# Patient Record
Sex: Male | Born: 1940 | Race: White | Hispanic: No | Marital: Single | Smoking: Former smoker
Health system: Southern US, Community
[De-identification: ages and names within clinical notes are randomized; demographics above are authoritative.]

## PROBLEM LIST (undated history)

## (undated) DIAGNOSIS — I10 Essential (primary) hypertension: Secondary | ICD-10-CM

## (undated) DIAGNOSIS — Z87442 Personal history of urinary calculi: Secondary | ICD-10-CM

## (undated) DIAGNOSIS — K219 Gastro-esophageal reflux disease without esophagitis: Secondary | ICD-10-CM

## (undated) DIAGNOSIS — M1990 Unspecified osteoarthritis, unspecified site: Secondary | ICD-10-CM

## (undated) DIAGNOSIS — D649 Anemia, unspecified: Secondary | ICD-10-CM

## (undated) HISTORY — PX: HERNIA REPAIR: SHX51

## (undated) HISTORY — PX: TONSILLECTOMY: SUR1361

## (undated) HISTORY — PX: ROTATOR CUFF REPAIR: SHX139

---

## 2020-12-13 ENCOUNTER — Other Ambulatory Visit: Payer: Self-pay | Admitting: Neurosurgery

## 2020-12-21 ENCOUNTER — Encounter (HOSPITAL_COMMUNITY): Payer: Self-pay

## 2020-12-21 ENCOUNTER — Other Ambulatory Visit: Payer: Self-pay

## 2020-12-21 ENCOUNTER — Encounter (HOSPITAL_COMMUNITY)
Admission: RE | Admit: 2020-12-21 | Discharge: 2020-12-21 | Disposition: A | Discharge status: Home / Self Care | Payer: Medicare Other | Source: Ambulatory Visit | Attending: Neurosurgery | Admitting: Neurosurgery

## 2020-12-21 DIAGNOSIS — Z20822 Contact with and (suspected) exposure to covid-19: Secondary | ICD-10-CM | POA: Diagnosis not present

## 2020-12-21 DIAGNOSIS — Z01812 Encounter for preprocedural laboratory examination: Secondary | ICD-10-CM | POA: Diagnosis present

## 2020-12-21 HISTORY — DX: Personal history of urinary calculi: Z87.442

## 2020-12-21 HISTORY — DX: Gastro-esophageal reflux disease without esophagitis: K21.9

## 2020-12-21 HISTORY — DX: Essential (primary) hypertension: I10

## 2020-12-21 HISTORY — DX: Unspecified osteoarthritis, unspecified site: M19.90

## 2020-12-21 HISTORY — DX: Anemia, unspecified: D64.9

## 2020-12-21 LAB — BASIC METABOLIC PANEL
Anion gap: 8 (ref 5–15)
BUN: 31 mg/dL — ABNORMAL HIGH (ref 8–23)
CO2: 24 mmol/L (ref 22–32)
Calcium: 9.1 mg/dL (ref 8.9–10.3)
Chloride: 107 mmol/L (ref 98–111)
Creatinine, Ser: 1.18 mg/dL (ref 0.61–1.24)
GFR, Estimated: >60 mL/min (ref >60)
Glucose, Bld: 88 mg/dL (ref 70–99)
Potassium: 4.1 mmol/L (ref 3.5–5.1)
Sodium: 139 mmol/L (ref 135–145)

## 2020-12-21 LAB — SURGICAL PCR SCREEN
MRSA, PCR: POSITIVE — AB
Staphylococcus aureus: POSITIVE — AB

## 2020-12-21 LAB — CBC
HCT: 36.9 % — ABNORMAL LOW (ref 39.0–52.0)
Hemoglobin: 12.4 g/dL — ABNORMAL LOW (ref 13.0–17.0)
MCH: 31.2 pg (ref 26.0–34.0)
MCHC: 33.6 g/dL (ref 30.0–36.0)
MCV: 92.9 fL (ref 80.0–100.0)
Platelets: 194 10*3/uL (ref 150–400)
RBC: 3.97 MIL/uL — ABNORMAL LOW (ref 4.22–5.81)
RDW: 12.9 % (ref 11.5–15.5)
WBC: 7.1 10*3/uL (ref 4.0–10.5)
nRBC: 0 % (ref 0.0–0.2)

## 2020-12-21 LAB — TYPE AND SCREEN
ABO/RH(D): A POS
Antibody Screen: NEGATIVE

## 2020-12-21 LAB — SARS CORONAVIRUS 2 (TAT 6-24 HRS): SARS Coronavirus 2: NEGATIVE

## 2020-12-21 NOTE — Progress Notes (Signed)
Lab called stating patient was +MRSA. Will need to be treated with Profend on DOS per protocol.

## 2020-12-21 NOTE — Progress Notes (Signed)
PCP: Alinda Deem, MD Cardiologist: Dr. Kathryne Sharper in [place]  EKG: 12/21/20 CXR: na ECHO: denies Stress Test: 2020.  Requested record Cardiac Cath: denies  Fasting Blood Sugar- na Checks Blood Sugar__na_ times a day  OSA/CPAP: No  ASA/Blood Thinners: No  Covid test 12/21/20 at PAT  Anesthesia Review: No, only saw cards because he passed out after playing tennis.  Negative cardiac workup per patient.  States it was from heat.  Patient denies shortness of breath, fever, cough, and chest pain at PAT appointment.  Patient verbalized understanding of instructions provided today at the PAT appointment.  Patient asked to review instructions at home and day of surgery.

## 2020-12-21 NOTE — Progress Notes (Signed)
Surgical Instructions    Your procedure is scheduled on 12/25/20.  Report to [facility] Main Entrance "A" at 5:30 A.M., then check in with the Admitting office.  Call this number if you have problems the morning of surgery:  [phone]   If you have any questions prior to your surgery date call [phone]: Open Monday-Friday 8am-4pm    Remember:  Do not eat aor drink fter midnight the night before your surgery     Take these medicines the morning of surgery with A SIP OF WATER: gabapentin (NEURONTIN) levothyroxine (SYNTHROID) simvastatin (ZOCOR)  traMADol (ULTRAM) - if needed    As of today, STOP taking any Aspirin (unless otherwise instructed by your surgeon), Meloxicam, Aleve, Naproxen, Ibuprofen, Motrin, Advil, Goody's, BC's, all herbal medications, fish oil, and all vitamins.          Do not wear jewelry  Do not wear lotions, powders, colognes, or deodorant. Do not shave 48 hours prior to surgery.  Men may shave face and neck. Do not bring valuables to the hospital.              [facility] is not responsible for any belongings or valuables.  Do NOT Smoke (Tobacco/Vaping) or drink Alcohol 24 hours prior to your procedure If you use a CPAP at night, you may bring all equipment for your overnight stay.   Contacts, glasses, dentures or bridgework may not be worn into surgery, please bring cases for these belongings   For patients admitted to the hospital, discharge time will be determined by your treatment team.   Patients discharged the day of surgery will not be allowed to drive home, and someone needs to stay with them for 24 hours.  ONLY 1 SUPPORT PERSON MAY BE PRESENT WHILE YOU ARE IN SURGERY. IF YOU ARE TO BE ADMITTED ONCE YOU ARE IN YOUR ROOM YOU WILL BE ALLOWED TWO (2) VISITORS.  Minor children may have two parents present. Special consideration for safety and communication needs will be reviewed on a case by case basis.  Special instructions:    Oral Hygiene  is also important to reduce your risk of infection.  Remember - BRUSH YOUR TEETH THE MORNING OF SURGERY WITH YOUR REGULAR TOOTHPASTE   [place]- Preparing For Surgery  Before surgery, you can play an important role. Because skin is not sterile, your skin needs to be as free of germs as possible. You can reduce the number of germs on your skin by washing with CHG (chlorahexidine gluconate) Soap before surgery.  CHG is an antiseptic cleaner which kills germs and bonds with the skin to continue killing germs even after washing.     Please do not use if you have an allergy to CHG or antibacterial soaps. If your skin becomes reddened/irritated stop using the CHG.  Do not shave (including legs and underarms) for at least 48 hours prior to first CHG shower. It is OK to shave your face.  Please follow these instructions carefully.     Shower the NIGHT BEFORE SURGERY and the MORNING OF SURGERY with CHG Soap.   If you chose to wash your hair, wash your hair first as usual with your normal shampoo. After you shampoo, rinse your hair and body thoroughly to remove the shampoo.  Then [employer] and genitals (private parts) with your normal soap and rinse thoroughly to remove soap.  After that Use CHG Soap as you would any other liquid soap. You can apply CHG directly to the  skin and wash gently with a scrungie or a clean washcloth.   Apply the CHG Soap to your body ONLY FROM THE NECK DOWN.  Do not use on open wounds or open sores. Avoid contact with your eyes, ears, mouth and genitals (private parts). Wash Face and genitals (private parts)  with your normal soap.   Wash thoroughly, paying special attention to the area where your surgery will be performed.  Thoroughly rinse your body with warm water from the neck down.  DO NOT shower/wash with your normal soap after using and rinsing off the CHG Soap.  Pat yourself dry with a CLEAN TOWEL.  Wear CLEAN PAJAMAS to bed the night before surgery  Place  CLEAN SHEETS on your bed the night before your surgery  DO NOT SLEEP WITH PETS.   Day of Surgery:  Take a shower with CHG soap. Wear Clean/Comfortable clothing the morning of surgery Do not apply any deodorants/lotions.   Remember to brush your teeth WITH YOUR REGULAR TOOTHPASTE.   Please read over the following fact sheets that you were given.

## 2020-12-24 ENCOUNTER — Other Ambulatory Visit: Payer: Self-pay | Admitting: Neurosurgery

## 2020-12-24 NOTE — Anesthesia Preprocedure Evaluation (Addendum)
Anesthesia Evaluation  Patient identified by MRN, date of birth, ID band Patient awake    Reviewed: Allergy & Precautions, NPO status , Patient's Chart, lab work & pertinent test results  History of Anesthesia Complications Negative for: history of anesthetic complications  Airway Mallampati: III  TM Distance: >3 FB Neck ROM: Full    Dental  (+) Teeth Intact, Dental Advisory Given, Caps,    Pulmonary former smoker,    Pulmonary exam normal breath sounds clear to auscultation       Cardiovascular hypertension, Pt. on medications (-) angina(-) CAD and (-) Past MI Normal cardiovascular exam Rhythm:Regular Rate:Normal     Neuro/Psych Spinal stenosis of lumbar region with neurogenic claudication    GI/Hepatic Neg liver ROS, GERD  ,  Endo/Other  Hypothyroidism   Renal/GU negative Renal ROS     Musculoskeletal  (+) Arthritis ,   Abdominal   Peds  Hematology  (+) Blood dyscrasia, anemia ,   Anesthesia Other Findings   Reproductive/Obstetrics                           Anesthesia Physical Anesthesia Plan  ASA: 2  Anesthesia Plan: General   Post-op Pain Management:    Induction: Intravenous  PONV Risk Score and Plan: 2 and Dexamethasone and Ondansetron  Airway Management Planned: Oral ETT  Additional Equipment:   Intra-op Plan:   Post-operative Plan: Extubation in OR  Informed Consent: I have reviewed the patients History and Physical, chart, labs and discussed the procedure including the risks, benefits and alternatives for the proposed anesthesia with the patient or authorized representative who has indicated his/her understanding and acceptance.     Dental advisory given  Plan Discussed with: CRNA  Anesthesia Plan Comments:        Anesthesia Quick Evaluation

## 2020-12-24 NOTE — Progress Notes (Signed)
Patient called today with emergency contact information.   Brother: Jah, Alarid Cell:  [phone] Home: [phone]

## 2020-12-24 NOTE — H&P (Signed)
Patient ID:   [id number] Patient: Harry Hale  Date of Birth: 1940-09-10 Visit Type: Office Visit   Date: 12/12/2020 09:45 AM Provider: Danae Orleans. Venetia Maxon MD   This 80 year old male presents for back pain.  HISTORY OF PRESENT ILLNESS: 1.  back pain  Patient returns for review of studies and discussion of surgical options.  He was seen by Dr. Alesia Morin in [place] on May 27th who suggested L4-5 decompression and fusion.  He has seen Dr. Myrle Sheng for 2 epidural injections in the past.  Currently he reports bilateral lower extremity pain with rectal numbness and "hot" pain periodically.  Tramadol 50 mg at bedtime "miracle drug" helpful, allows rest Gabapentin 300 mg taken b.i.d.  X-ray and MRI on canopy  I reviewed the patient's imaging with him.  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  I reviewed his imaging with him and my recommendation is based on the severity of his back pain and bilateral lower extremity pain that we go ahead with decompression and fusion at the L4-5 level.  The patient is here to get some relief and says he cannot continue to live like he is.   He has EHL weakness at 4/5 on the right.  He has a positive seated straight leg raise on the right.      Medical/Surgical/Interim History Reviewed, no change.  Last detailed document date:11/13/2020.     PAST MEDICAL HISTORY, SURGICAL HISTORY, FAMILY HISTORY, SOCIAL HISTORY AND REVIEW OF SYSTEMS I have reviewed the patient's past medical, surgical, family and social history as well as the comprehensive review of systems as included on the [facility] history form dated 11/13/2020, which I have signed.  Family History: Reviewed, no changes.  Last  detailed document date:11/13/2020.   Social History: Reviewed, no changes. Last detailed document date: 11/13/2020.    MEDICATIONS: (added, continued or stopped this visit) Started Medication Directions Instruction Stopped  gabapentin 300 mg capsule take 1 capsule by oral route 2 times every day    levothyroxine 88 mcg capsule take 1 capsule by oral route  every day    losartan 50 mg tablet take 1 tablet by oral route  every day   11/13/2020 methocarbamol 500 mg tablet take 1 tablet by oral route 3 times every day as needed for spasms    Mobic 15 mg tablet take 1 tablet by oral route  every day    multivitamin tablet take 1 tablet by oral route  every day    simvastatin 10 mg tablet take 1 tablet by oral route  every day in the evening   12/03/2020 tramadol 50 mg tablet take 1 tablet by oral route  every 6 hours as needed  12/13/2020    ALLERGIES: Ingredient Reaction Medication Name Comment NO KNOWN ALLERGIES    No known allergies. Reviewed, no changes.    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  The patient is having bilateral lower extremity and low-back pain.  He has weakness in  his right foot.  PLAN: L4-5 decompression and fusion.  This has been scheduled for 12/25/2020 at [facility].  Risks and benefits were discussed in detail with the patient and he wishes to proceed with surgery.  Patient education was performed in detail today.  He was given a prescription for a lumbosacral orthosis.  Orders: Diagnostic Procedures: Assessment Procedure M54.16 Lumbar Spine- AP/Lat Instruction(s)/Education: Assessment Instruction I10 Lifestyle education Z68.25 Dietary management education, guidance, and  counseling Miscellaneous: Assessment  M48.062 LSO Brace  Completed Orders (this encounter) Order Details Reason Side Interpretation Result Initial Treatment Date Region Lifestyle education Patient will follow up with Primary Care Physician.       Dietary management education, guidance, and counseling Encouraged patient to eat well balanced diet.        Assessment/Plan  # Detail Type Description  1. 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     2. Assessment Lumbar foraminal stenosis (M48.061).     3. Assessment Lumbar facet arthropathy (M47.816).     4. 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     5. 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     6. Assessment Spinal stenosis of lumbar region with neurogenic claudication (M48.062).  Plan Orders LSO Brace.     7. Assessment Lumbar radiculopathy (M54.16).     8. Assessment Essential (primary) hypertension (I10).     9. Assessment Body mass index (BMI) 25.0-25.9, adult (S50.53).  Plan Orders Today's instructions / counseling include(s) Dietary management education, guidance, and counseling. Clinical information/comments: Encouraged patient to eat well balanced diet.       Pain Management Plan Pain Scale: 7/10. Method: Numeric Pain Intensity Scale. Location: back. Onset: 07/29/2009. Duration: varies. Quality: discomforting. Pain management follow-up plan of care: Patient will continue medication management..              Provider:  Danae Orleans. Venetia Maxon MD  12/14/2020 04:17 PM    Dictation edited by: Danae Orleans. Venetia Maxon    CC Providers: Loretta Plume [address] [place],  [place]  [address]-   [facility]  [facility] [address], [address] [place], [place] [address]               Electronically signed by Danae Orleans. Venetia Maxon MD on 12/14/2020 04:17 PM

## 2020-12-25 ENCOUNTER — Inpatient Hospital Stay (HOSPITAL_COMMUNITY): Payer: Medicare Other

## 2020-12-25 ENCOUNTER — Observation Stay (HOSPITAL_COMMUNITY)
Admission: RE | Admit: 2020-12-25 | Discharge: 2020-12-26 | Disposition: A | Discharge status: Home / Self Care | Payer: Medicare Other | Attending: Neurosurgery | Admitting: Neurosurgery

## 2020-12-25 ENCOUNTER — Inpatient Hospital Stay (HOSPITAL_COMMUNITY): Payer: Medicare Other | Admitting: Anesthesiology

## 2020-12-25 ENCOUNTER — Encounter (HOSPITAL_COMMUNITY): Payer: Self-pay | Admitting: Neurosurgery

## 2020-12-25 ENCOUNTER — Encounter (HOSPITAL_COMMUNITY)
Admission: RE | Disposition: A | Discharge status: Home / Self Care | Payer: Self-pay | Source: Home / Self Care | Attending: Neurosurgery

## 2020-12-25 DIAGNOSIS — M4726 Other spondylosis with radiculopathy, lumbar region: Secondary | ICD-10-CM | POA: Insufficient documentation

## 2020-12-25 DIAGNOSIS — M48062 Spinal stenosis, lumbar region with neurogenic claudication: Secondary | ICD-10-CM | POA: Insufficient documentation

## 2020-12-25 DIAGNOSIS — I10 Essential (primary) hypertension: Secondary | ICD-10-CM | POA: Diagnosis not present

## 2020-12-25 DIAGNOSIS — M4316 Spondylolisthesis, lumbar region: Secondary | ICD-10-CM | POA: Diagnosis present

## 2020-12-25 DIAGNOSIS — Z79899 Other long term (current) drug therapy: Secondary | ICD-10-CM | POA: Insufficient documentation

## 2020-12-25 DIAGNOSIS — M5126 Other intervertebral disc displacement, lumbar region: Secondary | ICD-10-CM | POA: Diagnosis not present

## 2020-12-25 DIAGNOSIS — Z28311 Partially vaccinated for covid-19: Secondary | ICD-10-CM | POA: Insufficient documentation

## 2020-12-25 DIAGNOSIS — M5116 Intervertebral disc disorders with radiculopathy, lumbar region: Secondary | ICD-10-CM | POA: Diagnosis not present

## 2020-12-25 DIAGNOSIS — M7138 Other bursal cyst, other site: Principal | ICD-10-CM | POA: Insufficient documentation

## 2020-12-25 DIAGNOSIS — Z419 Encounter for procedure for purposes other than remedying health state, unspecified: Secondary | ICD-10-CM

## 2020-12-25 DIAGNOSIS — M549 Dorsalgia, unspecified: Secondary | ICD-10-CM | POA: Diagnosis present

## 2020-12-25 LAB — ABO/RH: ABO/RH(D): A POS

## 2020-12-25 SURGERY — POSTERIOR LUMBAR FUSION 1 LEVEL
Anesthesia: General | Site: Back

## 2020-12-25 MED ORDER — BUPIVACAINE HCL (PF) 0.5 % IJ SOLN
INTRAMUSCULAR | Status: DC | PRN
  Administered 2020-12-25: 5 mL

## 2020-12-25 MED ORDER — OXYCODONE HCL 5 MG PO TABS
5.0000 mg | ORAL_TABLET | ORAL | Status: DC | PRN
  Administered 2020-12-25: 5 mg via ORAL
  Administered 2020-12-25: 10 mg via ORAL
  Filled 2020-12-25 (×3): qty 2
  Filled 2020-12-25: qty 1

## 2020-12-25 MED ORDER — CHLORHEXIDINE GLUCONATE CLOTH 2 % EX PADS: 6.0000 | MEDICATED_PAD | Freq: Once | CUTANEOUS | Status: DC

## 2020-12-25 MED ORDER — LEVOTHYROXINE SODIUM 88 MCG PO TABS
88.0000 ug | ORAL_TABLET | Freq: Every day | ORAL | Status: DC
  Administered 2020-12-26: 88 ug via ORAL
  Filled 2020-12-25 (×2): qty 1

## 2020-12-25 MED ORDER — BISACODYL 10 MG RE SUPP: 10.0000 mg | Freq: Every day | RECTAL | Status: DC | PRN

## 2020-12-25 MED ORDER — FENTANYL CITRATE (PF) 100 MCG/2ML IJ SOLN
INTRAMUSCULAR | Status: AC
  Filled 2020-12-25: qty 2

## 2020-12-25 MED ORDER — SODIUM CHLORIDE 0.9 % IV SOLN
250.0000 mL | INTRAVENOUS | Status: DC
  Administered 2020-12-25: 250 mL via INTRAVENOUS

## 2020-12-25 MED ORDER — CEFAZOLIN SODIUM-DEXTROSE 2-4 GM/100ML-% IV SOLN
INTRAVENOUS | Status: AC
  Filled 2020-12-25: qty 100

## 2020-12-25 MED ORDER — 0.9 % SODIUM CHLORIDE (POUR BTL) OPTIME
TOPICAL | Status: DC | PRN
  Administered 2020-12-25: 1000 mL

## 2020-12-25 MED ORDER — SUGAMMADEX SODIUM 200 MG/2ML IV SOLN
INTRAVENOUS | Status: DC | PRN
  Administered 2020-12-25: 200 mg via INTRAVENOUS

## 2020-12-25 MED ORDER — BUPIVACAINE HCL (PF) 0.5 % IJ SOLN
INTRAMUSCULAR | Status: AC
  Filled 2020-12-25: qty 30

## 2020-12-25 MED ORDER — ONDANSETRON HCL 4 MG/2ML IJ SOLN: 4.0000 mg | Freq: Four times a day (QID) | INTRAMUSCULAR | Status: DC | PRN

## 2020-12-25 MED ORDER — SODIUM CHLORIDE 0.9% FLUSH
3.0000 mL | Freq: Two times a day (BID) | INTRAVENOUS | Status: DC
  Administered 2020-12-25: 3 mL via INTRAVENOUS

## 2020-12-25 MED ORDER — DEXAMETHASONE SODIUM PHOSPHATE 10 MG/ML IJ SOLN
INTRAMUSCULAR | Status: DC | PRN
  Administered 2020-12-25: 10 mg via INTRAVENOUS

## 2020-12-25 MED ORDER — LIDOCAINE 2% (20 MG/ML) 5 ML SYRINGE
INTRAMUSCULAR | Status: DC | PRN
  Administered 2020-12-25: 40 mg via INTRAVENOUS

## 2020-12-25 MED ORDER — LIDOCAINE-EPINEPHRINE 1 %-1:100000 IJ SOLN
INTRAMUSCULAR | Status: DC | PRN
  Administered 2020-12-25: 5 mL

## 2020-12-25 MED ORDER — KCL IN DEXTROSE-NACL 20-5-0.45 MEQ/L-%-% IV SOLN: INTRAVENOUS | Status: DC

## 2020-12-25 MED ORDER — ROCURONIUM BROMIDE 10 MG/ML (PF) SYRINGE
PREFILLED_SYRINGE | INTRAVENOUS | Status: DC | PRN
  Administered 2020-12-25: 20 mg via INTRAVENOUS
  Administered 2020-12-25: 60 mg via INTRAVENOUS

## 2020-12-25 MED ORDER — LOSARTAN POTASSIUM 50 MG PO TABS
100.0000 mg | ORAL_TABLET | Freq: Every day | ORAL | Status: DC
  Administered 2020-12-25: 100 mg via ORAL
  Filled 2020-12-25: qty 2

## 2020-12-25 MED ORDER — PHENYLEPHRINE HCL-NACL 10-0.9 MG/250ML-% IV SOLN
INTRAVENOUS | Status: DC | PRN
  Administered 2020-12-25: 20 ug/min via INTRAVENOUS

## 2020-12-25 MED ORDER — ACETAMINOPHEN 500 MG PO TABS: 1000.0000 mg | ORAL_TABLET | Freq: Once | ORAL | Status: AC

## 2020-12-25 MED ORDER — PROPOFOL 10 MG/ML IV BOLUS
INTRAVENOUS | Status: AC
  Filled 2020-12-25: qty 20

## 2020-12-25 MED ORDER — METHOCARBAMOL 500 MG PO TABS
500.0000 mg | ORAL_TABLET | Freq: Four times a day (QID) | ORAL | Status: DC | PRN
  Administered 2020-12-25 (×2): 500 mg via ORAL
  Filled 2020-12-25 (×3): qty 1

## 2020-12-25 MED ORDER — ONDANSETRON HCL 4 MG/2ML IJ SOLN
INTRAMUSCULAR | Status: DC | PRN
  Administered 2020-12-25: 4 mg via INTRAVENOUS

## 2020-12-25 MED ORDER — ORAL CARE MOUTH RINSE: 15.0000 mL | Freq: Once | OROMUCOSAL | Status: AC

## 2020-12-25 MED ORDER — THROMBIN 5000 UNITS EX SOLR
CUTANEOUS | Status: AC
  Filled 2020-12-25: qty 5000

## 2020-12-25 MED ORDER — THROMBIN 5000 UNITS EX SOLR: OROMUCOSAL | Status: DC | PRN

## 2020-12-25 MED ORDER — DOCUSATE SODIUM 100 MG PO CAPS
100.0000 mg | ORAL_CAPSULE | Freq: Two times a day (BID) | ORAL | Status: DC
  Administered 2020-12-25 (×2): 100 mg via ORAL
  Filled 2020-12-25 (×2): qty 1

## 2020-12-25 MED ORDER — CEFAZOLIN SODIUM-DEXTROSE 2-4 GM/100ML-% IV SOLN: 2.0000 g | INTRAVENOUS | Status: DC

## 2020-12-25 MED ORDER — SIMVASTATIN 5 MG PO TABS
10.0000 mg | ORAL_TABLET | Freq: Every day | ORAL | Status: DC
  Administered 2020-12-25: 10 mg via ORAL
  Filled 2020-12-25 (×2): qty 2

## 2020-12-25 MED ORDER — ZOLPIDEM TARTRATE 5 MG PO TABS: 5.0000 mg | ORAL_TABLET | Freq: Every evening | ORAL | Status: DC | PRN

## 2020-12-25 MED ORDER — KETOROLAC TROMETHAMINE 15 MG/ML IJ SOLN
7.5000 mg | Freq: Four times a day (QID) | INTRAMUSCULAR | Status: AC
  Administered 2020-12-25 – 2020-12-26 (×4): 7.5 mg via INTRAVENOUS
  Filled 2020-12-25 (×4): qty 1

## 2020-12-25 MED ORDER — VANCOMYCIN HCL IN DEXTROSE 1-5 GM/200ML-% IV SOLN
INTRAVENOUS | Status: AC
  Administered 2020-12-25: 1000 mg via INTRAVENOUS
  Filled 2020-12-25: qty 200

## 2020-12-25 MED ORDER — BUPIVACAINE LIPOSOME 1.3 % IJ SUSP
INTRAMUSCULAR | Status: DC | PRN
  Administered 2020-12-25: 20 mL

## 2020-12-25 MED ORDER — ONDANSETRON HCL 4 MG/2ML IJ SOLN: 4.0000 mg | Freq: Once | INTRAMUSCULAR | Status: DC | PRN

## 2020-12-25 MED ORDER — ALUM & MAG HYDROXIDE-SIMETH 200-200-20 MG/5ML PO SUSP: 30.0000 mL | Freq: Four times a day (QID) | ORAL | Status: DC | PRN

## 2020-12-25 MED ORDER — GABAPENTIN 300 MG PO CAPS
300.0000 mg | ORAL_CAPSULE | Freq: Two times a day (BID) | ORAL | Status: DC
  Administered 2020-12-25: 300 mg via ORAL
  Filled 2020-12-25: qty 1

## 2020-12-25 MED ORDER — CHLORHEXIDINE GLUCONATE 0.12 % MT SOLN
OROMUCOSAL | Status: AC
  Administered 2020-12-25: 15 mL via OROMUCOSAL
  Filled 2020-12-25: qty 15

## 2020-12-25 MED ORDER — LABETALOL HCL 5 MG/ML IV SOLN
INTRAVENOUS | Status: AC
  Filled 2020-12-25: qty 4

## 2020-12-25 MED ORDER — DEXAMETHASONE SODIUM PHOSPHATE 10 MG/ML IJ SOLN
INTRAMUSCULAR | Status: AC
  Filled 2020-12-25: qty 1

## 2020-12-25 MED ORDER — ACETAMINOPHEN 500 MG PO TABS
ORAL_TABLET | ORAL | Status: AC
  Administered 2020-12-25: 1000 mg via ORAL
  Filled 2020-12-25: qty 2

## 2020-12-25 MED ORDER — LIDOCAINE-EPINEPHRINE 1 %-1:100000 IJ SOLN
INTRAMUSCULAR | Status: AC
  Filled 2020-12-25: qty 1

## 2020-12-25 MED ORDER — EPHEDRINE 5 MG/ML INJ
INTRAVENOUS | Status: AC
  Filled 2020-12-25: qty 10

## 2020-12-25 MED ORDER — ONDANSETRON HCL 4 MG/2ML IJ SOLN
INTRAMUSCULAR | Status: AC
  Filled 2020-12-25: qty 2

## 2020-12-25 MED ORDER — ACETAMINOPHEN 650 MG RE SUPP: 650.0000 mg | RECTAL | Status: DC | PRN

## 2020-12-25 MED ORDER — VANCOMYCIN HCL IN DEXTROSE 1-5 GM/200ML-% IV SOLN: 1000.0000 mg | INTRAVENOUS | Status: AC

## 2020-12-25 MED ORDER — GLYCOPYRROLATE PF 0.2 MG/ML IJ SOSY
PREFILLED_SYRINGE | INTRAMUSCULAR | Status: DC | PRN
  Administered 2020-12-25: .2 mg via INTRAVENOUS

## 2020-12-25 MED ORDER — PANTOPRAZOLE SODIUM 40 MG IV SOLR
40.0000 mg | Freq: Every day | INTRAVENOUS | Status: DC
  Administered 2020-12-25: 40 mg via INTRAVENOUS
  Filled 2020-12-25: qty 40

## 2020-12-25 MED ORDER — LACTATED RINGERS IV SOLN: INTRAVENOUS | Status: DC

## 2020-12-25 MED ORDER — ACETAMINOPHEN 325 MG PO TABS: 650.0000 mg | ORAL_TABLET | ORAL | Status: DC | PRN

## 2020-12-25 MED ORDER — PHENOL 1.4 % MT LIQD
1.0000 | OROMUCOSAL | Status: DC | PRN
  Administered 2020-12-25: 1 via OROMUCOSAL
  Filled 2020-12-25: qty 177

## 2020-12-25 MED ORDER — FENTANYL CITRATE (PF) 250 MCG/5ML IJ SOLN
INTRAMUSCULAR | Status: AC
  Filled 2020-12-25: qty 5

## 2020-12-25 MED ORDER — POLYETHYLENE GLYCOL 3350 17 G PO PACK: 17.0000 g | PACK | Freq: Every day | ORAL | Status: DC | PRN

## 2020-12-25 MED ORDER — FENTANYL CITRATE (PF) 100 MCG/2ML IJ SOLN
25.0000 ug | INTRAMUSCULAR | Status: DC | PRN
  Administered 2020-12-25: 50 ug via INTRAVENOUS

## 2020-12-25 MED ORDER — EPHEDRINE SULFATE-NACL 50-0.9 MG/10ML-% IV SOSY
PREFILLED_SYRINGE | INTRAVENOUS | Status: DC | PRN
  Administered 2020-12-25: 5 mg via INTRAVENOUS

## 2020-12-25 MED ORDER — HYDROMORPHONE HCL 1 MG/ML IJ SOLN: 0.5000 mg | INTRAMUSCULAR | Status: DC | PRN

## 2020-12-25 MED ORDER — BUPIVACAINE LIPOSOME 1.3 % IJ SUSP
INTRAMUSCULAR | Status: AC
  Filled 2020-12-25: qty 20

## 2020-12-25 MED ORDER — ADULT MULTIVITAMIN W/MINERALS CH
1.0000 | ORAL_TABLET | Freq: Every day | ORAL | Status: DC
  Administered 2020-12-25: 1 via ORAL
  Filled 2020-12-25: qty 1

## 2020-12-25 MED ORDER — SODIUM CHLORIDE 0.9% FLUSH: 3.0000 mL | INTRAVENOUS | Status: DC | PRN

## 2020-12-25 MED ORDER — HYDROCODONE-ACETAMINOPHEN 5-325 MG PO TABS: 1.0000 | ORAL_TABLET | ORAL | Status: DC | PRN

## 2020-12-25 MED ORDER — METHOCARBAMOL 1000 MG/10ML IJ SOLN
500.0000 mg | Freq: Four times a day (QID) | INTRAVENOUS | Status: DC | PRN
  Filled 2020-12-25: qty 5

## 2020-12-25 MED ORDER — LACTATED RINGERS IV SOLN: INTRAVENOUS | Status: DC | PRN

## 2020-12-25 MED ORDER — CHLORHEXIDINE GLUCONATE 0.12 % MT SOLN: 15.0000 mL | Freq: Once | OROMUCOSAL | Status: AC

## 2020-12-25 MED ORDER — LIDOCAINE HCL (PF) 2 % IJ SOLN
INTRAMUSCULAR | Status: AC
  Filled 2020-12-25: qty 5

## 2020-12-25 MED ORDER — MENTHOL 3 MG MT LOZG
1.0000 | LOZENGE | OROMUCOSAL | Status: DC | PRN
  Filled 2020-12-25: qty 9

## 2020-12-25 MED ORDER — CENTRUM PO CHEW: 1.0000 | CHEWABLE_TABLET | Freq: Every day | ORAL | Status: DC

## 2020-12-25 MED ORDER — PROPOFOL 10 MG/ML IV BOLUS
INTRAVENOUS | Status: DC | PRN
  Administered 2020-12-25: 120 mg via INTRAVENOUS

## 2020-12-25 MED ORDER — ROCURONIUM BROMIDE 10 MG/ML (PF) SYRINGE
PREFILLED_SYRINGE | INTRAVENOUS | Status: AC
  Filled 2020-12-25: qty 10

## 2020-12-25 MED ORDER — LABETALOL HCL 5 MG/ML IV SOLN
10.0000 mg | Freq: Once | INTRAVENOUS | Status: AC
  Administered 2020-12-25: 10 mg via INTRAVENOUS

## 2020-12-25 MED ORDER — TRAMADOL HCL 50 MG PO TABS: 50.0000 mg | ORAL_TABLET | Freq: Four times a day (QID) | ORAL | Status: DC | PRN

## 2020-12-25 MED ORDER — FENTANYL CITRATE (PF) 250 MCG/5ML IJ SOLN
INTRAMUSCULAR | Status: DC | PRN
  Administered 2020-12-25 (×2): 75 ug via INTRAVENOUS
  Administered 2020-12-25 (×2): 50 ug via INTRAVENOUS

## 2020-12-25 MED ORDER — CEFAZOLIN SODIUM-DEXTROSE 2-4 GM/100ML-% IV SOLN
2.0000 g | Freq: Three times a day (TID) | INTRAVENOUS | Status: AC
  Administered 2020-12-25 (×2): 2 g via INTRAVENOUS
  Filled 2020-12-25 (×2): qty 100

## 2020-12-25 MED ORDER — ONDANSETRON HCL 4 MG PO TABS: 4.0000 mg | ORAL_TABLET | Freq: Four times a day (QID) | ORAL | Status: DC | PRN

## 2020-12-25 MED ORDER — FLEET ENEMA 7-19 GM/118ML RE ENEM: 1.0000 | ENEMA | Freq: Once | RECTAL | Status: DC | PRN

## 2020-12-25 SURGICAL SUPPLY — 72 items
BASKET BONE COLLECTION (BASKET) ×3 IMPLANT
BLADE CLIPPER SURG (BLADE) IMPLANT
BUR MATCHSTICK NEURO 3.0 LAGG (BURR) ×3 IMPLANT
BUR PRECISION FLUTE 5.0 (BURR) ×3 IMPLANT
CAGE COROENT LG 10X9X23-12 (Cage) ×6 IMPLANT
CANISTER SUCT 3000ML PPV (MISCELLANEOUS) ×3 IMPLANT
CARTRIDGE OIL MAESTRO DRILL (MISCELLANEOUS) ×1 IMPLANT
CNTNR URN SCR LID CUP LEK RST (MISCELLANEOUS) ×1 IMPLANT
CONT SPEC 4OZ STRL OR WHT (MISCELLANEOUS) ×2
COVER BACK TABLE 60X90IN (DRAPES) ×3 IMPLANT
COVER WAND RF STERILE (DRAPES) IMPLANT
DECANTER SPIKE VIAL GLASS SM (MISCELLANEOUS) IMPLANT
DERMABOND ADVANCED (GAUZE/BANDAGES/DRESSINGS) ×2
DERMABOND ADVANCED .7 DNX12 (GAUZE/BANDAGES/DRESSINGS) ×1 IMPLANT
DIFFUSER DRILL AIR PNEUMATIC (MISCELLANEOUS) ×3 IMPLANT
DRAPE C-ARM 42X72 X-RAY (DRAPES) ×3 IMPLANT
DRAPE C-ARMOR (DRAPES) ×3 IMPLANT
DRAPE LAPAROTOMY 100X72X124 (DRAPES) ×3 IMPLANT
DRAPE SURG 17X23 STRL (DRAPES) ×3 IMPLANT
DRSG OPSITE POSTOP 4X6 (GAUZE/BANDAGES/DRESSINGS) ×3 IMPLANT
DURAPREP 26ML APPLICATOR (WOUND CARE) ×3 IMPLANT
ELECT BLADE 4.0 EZ CLEAN MEGAD (MISCELLANEOUS) ×3
ELECT REM PT RETURN 9FT ADLT (ELECTROSURGICAL) ×3
ELECTRODE BLDE 4.0 EZ CLN MEGD (MISCELLANEOUS) ×1 IMPLANT
ELECTRODE REM PT RTRN 9FT ADLT (ELECTROSURGICAL) ×1 IMPLANT
EVACUATOR 1/8 PVC DRAIN (DRAIN) IMPLANT
GAUZE 4X4 16PLY RFD (DISPOSABLE) IMPLANT
GAUZE SPONGE 4X4 12PLY STRL (GAUZE/BANDAGES/DRESSINGS) IMPLANT
GLOVE BIO SURGEON STRL SZ8 (GLOVE) ×6 IMPLANT
GLOVE BIOGEL PI IND STRL 8.5 (GLOVE) ×2 IMPLANT
GLOVE BIOGEL PI INDICATOR 8.5 (GLOVE) ×4
GLOVE ECLIPSE 8.0 STRL XLNG CF (GLOVE) ×6 IMPLANT
GLOVE EXAM NITRILE XL STR (GLOVE) IMPLANT
GLOVE SRG 8 PF TXTR STRL LF DI (GLOVE) ×2 IMPLANT
GLOVE SURG UNDER POLY LF SZ8 (GLOVE) ×4
GOWN STRL REUS W/ TWL LRG LVL3 (GOWN DISPOSABLE) IMPLANT
GOWN STRL REUS W/ TWL XL LVL3 (GOWN DISPOSABLE) ×5 IMPLANT
GOWN STRL REUS W/TWL 2XL LVL3 (GOWN DISPOSABLE) ×6 IMPLANT
GOWN STRL REUS W/TWL LRG LVL3 (GOWN DISPOSABLE)
GOWN STRL REUS W/TWL XL LVL3 (GOWN DISPOSABLE) ×10
HEMOSTAT POWDER KIT SURGIFOAM (HEMOSTASIS) ×3 IMPLANT
KIT BASIN OR (CUSTOM PROCEDURE TRAY) ×3 IMPLANT
KIT INFUSE XX SMALL 0.7CC (Orthopedic Implant) ×3 IMPLANT
KIT POSITION SURG JACKSON T1 (MISCELLANEOUS) ×3 IMPLANT
KIT TURNOVER KIT B (KITS) ×3 IMPLANT
MILL MEDIUM DISP (BLADE) ×3 IMPLANT
NEEDLE HYPO 21X1.5 SAFETY (NEEDLE) ×3 IMPLANT
NEEDLE HYPO 25X1 1.5 SAFETY (NEEDLE) ×3 IMPLANT
NEEDLE SPNL 18GX3.5 QUINCKE PK (NEEDLE) IMPLANT
NS IRRIG 1000ML POUR BTL (IV SOLUTION) ×3 IMPLANT
OIL CARTRIDGE MAESTRO DRILL (MISCELLANEOUS) ×3
PACK LAMINECTOMY NEURO (CUSTOM PROCEDURE TRAY) ×3 IMPLANT
PAD ARMBOARD 7.5X6 YLW CONV (MISCELLANEOUS) ×9 IMPLANT
PATTIES SURGICAL .5 X.5 (GAUZE/BANDAGES/DRESSINGS) IMPLANT
PATTIES SURGICAL .5 X1 (DISPOSABLE) IMPLANT
PATTIES SURGICAL 1X1 (DISPOSABLE) IMPLANT
ROD RELINE-O LORD 5.5X40 (Rod) ×6 IMPLANT
SCREW LOCK RELINE 5.5 TULIP (Screw) ×12 IMPLANT
SCREW RELINE-O POLY 6.5X45 (Screw) ×12 IMPLANT
SPONGE LAP 4X18 RFD (DISPOSABLE) IMPLANT
SPONGE SURGIFOAM ABS GEL 100 (HEMOSTASIS) IMPLANT
STAPLER SKIN PROX WIDE 3.9 (STAPLE) IMPLANT
SUT VIC AB 1 CT1 18XBRD ANBCTR (SUTURE) ×1 IMPLANT
SUT VIC AB 1 CT1 8-18 (SUTURE) ×2
SUT VIC AB 2-0 CT1 18 (SUTURE) ×3 IMPLANT
SUT VIC AB 3-0 SH 8-18 (SUTURE) ×6 IMPLANT
SYR 20ML LL LF (SYRINGE) ×3 IMPLANT
SYR 5ML LL (SYRINGE) IMPLANT
TOWEL GREEN STERILE (TOWEL DISPOSABLE) ×3 IMPLANT
TOWEL GREEN STERILE FF (TOWEL DISPOSABLE) ×3 IMPLANT
TRAY FOLEY MTR SLVR 16FR STAT (SET/KITS/TRAYS/PACK) ×3 IMPLANT
WATER STERILE IRR 1000ML POUR (IV SOLUTION) ×3 IMPLANT

## 2020-12-25 NOTE — Anesthesia Procedure Notes (Signed)
Procedure Name: Intubation Date/Time: 12/25/2020 7:41 AM Performed by: Lanell Matar, CRNA Pre-anesthesia Checklist: Patient identified, Emergency Drugs available, Suction available and Patient being monitored Patient Re-evaluated:Patient Re-evaluated prior to induction Oxygen Delivery Method: Circle System Utilized Preoxygenation: Pre-oxygenation with 100% oxygen Induction Type: IV induction Ventilation: Mask ventilation without difficulty Laryngoscope Size: Miller and 2 Grade View: Grade II Tube type: Oral Tube size: 7.5 mm Number of attempts: 1 Airway Equipment and Method: Stylet and Oral airway Placement Confirmation: ETT inserted through vocal cords under direct vision, positive ETCO2 and breath sounds checked- equal and bilateral Secured at: 23 cm Tube secured with: Tape Dental Injury: Teeth and Oropharynx as per pre-operative assessment

## 2020-12-25 NOTE — Progress Notes (Signed)
Awake, alert, conversant.  MAEW with full power DF/EHL.  Patient states leg numbness is improved.  He is doing well.

## 2020-12-25 NOTE — Transfer of Care (Signed)
Immediate Anesthesia Transfer of Care Note  Patient: Harry Hale  Procedure(s) Performed: Lumbar four-five Decompression/Fusion/Resection of synovial cyst, posterior lumbar interbodyl fusion (Back)  Patient Location: PACU  Anesthesia Type:General  Level of Consciousness: awake, alert  and oriented  Airway & Oxygen Therapy: Patient Spontanous Breathing and Patient connected to nasal cannula oxygen  Post-op Assessment: Report given to RN, Post -op Vital signs reviewed and stable and Patient moving all extremities X 4  Post vital signs: Reviewed and stable  Last Vitals:  Vitals Value Taken Time  BP 173/86 12/25/20 1035  Temp 36.1 C 12/25/20 1035  Pulse 74 12/25/20 1035  Resp 10 12/25/20 1035  SpO2 100 % 12/25/20 1035  Vitals shown include unvalidated device data.  Last Pain:  Vitals:   12/25/20 0619  PainSc: 3          Complications: No notable events documented.

## 2020-12-25 NOTE — Anesthesia Postprocedure Evaluation (Signed)
Anesthesia Post Note  Patient: Harry Hale  Procedure(s) Performed: Lumbar four-five Decompression/Fusion/Resection of synovial cyst, posterior lumbar interbodyl fusion (Back)     Patient location during evaluation: PACU Anesthesia Type: General Level of consciousness: awake and alert Pain management: pain level controlled Vital Signs Assessment: post-procedure vital signs reviewed and stable Respiratory status: spontaneous breathing, nonlabored ventilation, respiratory function stable and patient connected to nasal cannula oxygen Cardiovascular status: blood pressure returned to baseline and stable Postop Assessment: no apparent nausea or vomiting Anesthetic complications: no   No notable events documented.  Last Vitals:  Vitals:   12/25/20 1135 12/25/20 1157  BP: (!) 177/81 (!) 159/91  Pulse: 60 60  Resp: 12 20  Temp: (!) 36.1 C (!) 36.4 C  SpO2: 97% 99%    Last Pain:  Vitals:   12/25/20 1157  TempSrc: Oral  PainSc:                  Cecile Hearing

## 2020-12-25 NOTE — Progress Notes (Signed)
Orthopedic Tech Progress Note Patient Details:  Harry Hale 1941/01/17 [id number]  Patient has BRACE. Saw it while in PACU  Patient ID: Harry Hale, male   DOB: 09/01/1940, 80 y.o.   MRN: [id number]  Harry Hale 12/25/2020, 12:10 PM

## 2020-12-25 NOTE — Brief Op Note (Signed)
12/25/2020  10:32 AM  PATIENT:  Harry Hale  80 y.o. male  PRE-OPERATIVE DIAGNOSIS:  Spinal stenosis of lumbar region with neurogenic claudication, spondylolisthesis, synovial cyst, lumbago, radiculopathy L 45 level  POST-OPERATIVE DIAGNOSIS:  Spinal stenosis of lumbar region with neurogenic claudication, spondylolisthesis, synovial cyst, lumbago, radiculopathy L 45 level  PROCEDURE:  Procedure(s): Lumbar four-five Decompression/Fusion/Resection of synovial cyst, posterior lumbar interbodyl fusion (N/A) with PEEK cages, autograft, pedicle screw fixation, posterolateral arthrodesis  SURGEON:  Surgeon(s) and Role:    * Liliana Dang, MD - Primary    * Oswaldo, Jonathan G, MD - Assisting  PHYSICIAN ASSISTANT: McDaniel, NP  ASSISTANTS: Poteat, RN   ANESTHESIA:   general  EBL:  125 mL   BLOOD ADMINISTERED:none  DRAINS: none   LOCAL MEDICATIONS USED:  MARCAINE    and LIDOCAINE   SPECIMEN:  No Specimen  DISPOSITION OF SPECIMEN:  N/A  COUNTS:  YES  TOURNIQUET:  * No tourniquets in log *  DICTATION: Patient is 80-year-old man with mobile 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 He has bilateral L5 radiculopathies. It was elected to take him to surgery for decompression and fusion at this level.   Procedure: Patient was placed in a prone position on the Jackson table after smooth and uncomplicated induction of general endotracheal anesthesia. His low back was prepped and draped in usual sterile fashion with betadine scrub and DuraPrep after localizing relevant bony anatomy with C arm and LessRay. Area of incision was infiltrated with local lidocaine. Incision was made to the lumbodorsal fascia was incised and exposure was performed of the L4 through L5 spinous processes laminae facet joint and transverse processes. Intraoperative x-ray was obtained which confirmed correct orientation. A total laminectomy of L4 was performed with disarticulation  of the facet joints at this level and thorough decompression was performed of both L4 and L5 nerve roots along with the common dural tube. This decompression was more involved than would be typical of that performed for PLIF alone and included painstaking dissection of adherent ligament compressing the thecal sac and wide decompression of all neural elements. A thorough discectomy was initially performed on the left with preparation of the endplates for grafting a trial spacer was placed this level and a thorough discectomy was performed on the right as well with careful dissection of the synovial cyst, exenteration of the cyst, and decompression of all neural elements.  Bilateral 10 x 9 x 23 mm x 12 degree peek cages were packed with extra extra small BMP and extender and was inserted the interspace and countersunk appropriately along with 10 cc of morselized bone autograft. The posterolateral region was extensively decorticated and pedicle probes were placed at L4 and L5 bilaterally. Intraoperative fluoroscopy confirmed correct orientationin the AP and lateral plane. 45 x 6.5 mm pedicle screws were placed at L5 bilaterally and 45 x 6.5 mm screws placed at L4 bilaterally final x-rays demonstrated well-positioned interbody grafts and pedicle screw fixation. A 40 mm lordotic rod was placed on the right and a 40 mm rod was placed on the left locked down in situ and the posterolateral region was packed with the remaining 15 cc bone autograft on the left.  The wound was irrigated and long-acting Marcaine was injected in the deep musculature. Fascia was closed with 1 Vicryl sutures skin edges were reapproximated 2 and 3-0 Vicryl sutures. The wound was dressed with Dermabond and an occlusive dressing.   the patient was extubated in   the operating room and taken to recovery in stable satisfactory condition. He tolerated the operation well counts were correct at the end of the case.   PLAN OF CARE: Admit to inpatient    PATIENT DISPOSITION:  PACU - hemodynamically stable.   Delay start of Pharmacological VTE agent (>24hrs) due to surgical blood loss or risk of bleeding: yes

## 2020-12-25 NOTE — Op Note (Signed)
12/25/2020  10:32 AM  PATIENT:  Harry Hale  80 y.o. male  PRE-OPERATIVE DIAGNOSIS:  Spinal stenosis of lumbar region with neurogenic claudication, spondylolisthesis, synovial cyst, lumbago, radiculopathy L 45 level  POST-OPERATIVE DIAGNOSIS:  Spinal stenosis of lumbar region with neurogenic claudication, spondylolisthesis, synovial cyst, lumbago, radiculopathy L 45 level  PROCEDURE:  Procedure(s): Lumbar four-five Decompression/Fusion/Resection of synovial cyst, posterior lumbar interbodyl fusion (N/A) with PEEK cages, autograft, pedicle screw fixation, posterolateral arthrodesis  SURGEON:  Surgeon(s) and Role:    Maeola Harman, MD - Primary    * Bedelia Person, MD - Assisting  PHYSICIAN ASSISTANT: Julien Girt, NP  ASSISTANTS: Poteat, RN   ANESTHESIA:   general  EBL:  125 mL   BLOOD ADMINISTERED:none  DRAINS: none   LOCAL MEDICATIONS USED:  MARCAINE    and LIDOCAINE   SPECIMEN:  No Specimen  DISPOSITION OF SPECIMEN:  N/A  COUNTS:  YES  TOURNIQUET:  * No tourniquets in log *  DICTATION: Patient is 80 year old man with mobile 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 He has bilateral L5 radiculopathies. It was elected to take him to surgery for decompression and fusion at this level.   Procedure: Patient was placed in a prone position on the Brinckerhoff table after smooth and uncomplicated induction of general endotracheal anesthesia. His low back was prepped and draped in usual sterile fashion with betadine scrub and DuraPrep after localizing relevant bony anatomy with C arm and LessRay. Area of incision was infiltrated with local lidocaine. Incision was made to the lumbodorsal fascia was incised and exposure was performed of the L4 through L5 spinous processes laminae facet joint and transverse processes. Intraoperative x-ray was obtained which confirmed correct orientation. A total laminectomy of L4 was performed with disarticulation  of the facet joints at this level and thorough decompression was performed of both L4 and L5 nerve roots along with the common dural tube. This decompression was more involved than would be typical of that performed for PLIF alone and included painstaking dissection of adherent ligament compressing the thecal sac and wide decompression of all neural elements. A thorough discectomy was initially performed on the left with preparation of the endplates for grafting a trial spacer was placed this level and a thorough discectomy was performed on the right as well with careful dissection of the synovial cyst, exenteration of the cyst, and decompression of all neural elements.  Bilateral 10 x 9 x 23 mm x 12 degree peek cages were packed with extra extra small BMP and extender and was inserted the interspace and countersunk appropriately along with 10 cc of morselized bone autograft. The posterolateral region was extensively decorticated and pedicle probes were placed at L4 and L5 bilaterally. Intraoperative fluoroscopy confirmed correct orientationin the AP and lateral plane. 45 x 6.5 mm pedicle screws were placed at L5 bilaterally and 45 x 6.5 mm screws placed at L4 bilaterally final x-rays demonstrated well-positioned interbody grafts and pedicle screw fixation. A 40 mm lordotic rod was placed on the right and a 40 mm rod was placed on the left locked down in situ and the posterolateral region was packed with the remaining 15 cc bone autograft on the left.  The wound was irrigated and long-acting Marcaine was injected in the deep musculature. Fascia was closed with 1 Vicryl sutures skin edges were reapproximated 2 and 3-0 Vicryl sutures. The wound was dressed with Dermabond and an occlusive dressing.   the patient was extubated in  the operating room and taken to recovery in stable satisfactory condition. He tolerated the operation well counts were correct at the end of the case.   PLAN OF CARE: Admit to inpatient    PATIENT DISPOSITION:  PACU - hemodynamically stable.   Delay start of Pharmacological VTE agent (>24hrs) due to surgical blood loss or risk of bleeding: yes

## 2020-12-25 NOTE — Interval H&P Note (Signed)
History and Physical Interval Note:  12/25/2020 7:30 AM  Harry Hale  has presented today for surgery, with the diagnosis of Spinal stenosis of lumbar region with neurogenic claudication.  The various methods of treatment have been discussed with the patient and family. After consideration of risks, benefits and other options for treatment, the patient has consented to  Procedure(s) with comments: Lumbar 4-5 Decompression/Fusion/Resection of synovial cyst, posterior versus transforaminal fusion (N/A) - 3C/RM 18 as a surgical intervention.  The patient's history has been reviewed, patient examined, no change in status, stable for surgery.  I have reviewed the patient's chart and labs.  Questions were answered to the patient's satisfaction.     Dorian Heckle

## 2020-12-26 DIAGNOSIS — M7138 Other bursal cyst, other site: Secondary | ICD-10-CM | POA: Diagnosis not present

## 2020-12-26 MED ORDER — HYDROCODONE-ACETAMINOPHEN 5-325 MG PO TABS: 1.0000 | ORAL_TABLET | Freq: Four times a day (QID) | ORAL | 0 refills | Status: AC | PRN

## 2020-12-26 MED ORDER — METHOCARBAMOL 500 MG PO TABS: 500.0000 mg | ORAL_TABLET | Freq: Four times a day (QID) | ORAL | 0 refills | Status: AC | PRN

## 2020-12-26 MED ORDER — PANTOPRAZOLE SODIUM 40 MG PO TBEC: 40.0000 mg | DELAYED_RELEASE_TABLET | Freq: Every day | ORAL | Status: DC

## 2020-12-26 NOTE — Care Management Obs Status (Signed)
MEDICARE OBSERVATION STATUS NOTIFICATION   Patient Details  Name: NASRI BOAKYE MRN: [id number] Date of Birth: 1941-06-29   Medicare Observation Status Notification Given:  Yes    Lorri Frederick, LCSW 12/26/2020, 9:59 AM

## 2020-12-26 NOTE — Plan of Care (Signed)
Adequately Ready for Discharge 

## 2020-12-26 NOTE — Evaluation (Signed)
Physical Therapy Evaluation Patient Details Name: Harry Hale MRN: [id number] DOB: [birth year] Today's Date: 12/26/2020   History of Present Illness  Pt is a 80 y.o. male s/p PLIF 12/25/20.  Clinical Impression  PT eval complete. Pt demonstrates modified independence bed mobility and transfers. Supervision ambulation 250' without AD and ascend/descend 1 flight of stairs with R rail. Pt educated on 3/3 back precautions. Handout provided. All education complete. Pt to d/c home today.     Follow Up Recommendations No PT follow up    Equipment Recommendations  None recommended by PT    Recommendations for Other Services       Precautions / Restrictions Precautions Precautions: Back Precaution Booklet Issued: Yes (comment) Precaution Comments: Educated on 3/3 back precautions. Handout provided. Required Braces or Orthoses: Spinal Brace Spinal Brace: Lumbar corset;Applied in sitting position      Mobility  Bed Mobility Overal bed mobility: Modified Independent                  Transfers Overall transfer level: Modified independent Equipment used: None                Ambulation/Gait Ambulation/Gait assistance: Supervision Gait Distance (Feet): 250 Feet Assistive device: None Gait Pattern/deviations: Step-through pattern;Decreased stride length Gait velocity: WFL Gait velocity interpretation: >2.62 ft/sec, indicative of community ambulatory General Gait Details: steady gait without AD  Stairs Stairs: Yes Stairs assistance: Supervision Stair Management: One rail Right;Alternating pattern;Forwards Number of Stairs: 12    Wheelchair Mobility    Modified Rankin (Stroke Patients Only)       Balance Overall balance assessment: No apparent balance deficits (not formally assessed)                                           Pertinent Vitals/Pain Pain Assessment: Faces Faces Pain Scale: Hurts little more Pain Location: back Pain  Descriptors / Indicators: Grimacing;Operative site guarding Pain Intervention(s): Monitored during session;Repositioned    Home Living Family/patient expects to be discharged to:: Private residence Living Arrangements: Alone Available Help at Discharge: Family;Available PRN/intermittently Type of Home: House Home Access: Stairs to enter   Entrance Stairs-Number of Steps: 1 Home Layout: Multi-level;Laundry or work area in [employer]: None      Prior Function Level of Independence: Independent               Higher education careers adviser        Extremity/Trunk Assessment   Upper Extremity Assessment Upper Extremity Assessment: Overall WFL for tasks assessed    Lower Extremity Assessment Lower Extremity Assessment: Overall WFL for tasks assessed    Cervical / Trunk Assessment Cervical / Trunk Assessment: Other exceptions Cervical / Trunk Exceptions: s/p PLIF  Communication   Communication: No difficulties  Cognition Arousal/Alertness: Awake/alert Behavior During Therapy: WFL for tasks assessed/performed Overall Cognitive Status: Within Functional Limits for tasks assessed                                        General Comments General comments (skin integrity, edema, etc.): VSS on RA    Exercises     Assessment/Plan    PT Assessment Patent does not need any further PT services  PT Problem List         PT Treatment Interventions  PT Goals (Current goals can be found in the Care Plan section)  Acute Rehab PT Goals Patient Stated Goal: home today PT Goal Formulation: All assessment and education complete, DC therapy    Frequency     Barriers to discharge        Co-evaluation               AM-PAC PT "6 Clicks" Mobility  Outcome Measure Help needed turning from your back to your side while in a flat bed without using bedrails?: None Help needed moving from lying on your back to sitting on the side of a flat bed without  using bedrails?: None Help needed moving to and from a bed to a chair (including a wheelchair)?: None Help needed standing up from a chair using your arms (e.g., wheelchair or bedside chair)?: None Help needed to walk in hospital room?: A Little Help needed climbing 3-5 steps with a railing? : A Little 6 Click Score: 22    End of Session Equipment Utilized During Treatment: Gait belt;Back brace Activity Tolerance: Patient tolerated treatment well Patient left: in chair;with call bell/phone within reach Nurse Communication: Mobility status PT Visit Diagnosis: Difficulty in walking, not elsewhere classified (R26.2);Pain    Time: 0850-0910 PT Time Calculation (min) (ACUTE ONLY): 20 min   Charges:   PT Evaluation $PT Eval Moderate Complexity: 1 Mod          Aida Raider, PT  Office # [phone] Pager [phone]   Ilda Foil 12/26/2020, 9:53 AM

## 2020-12-26 NOTE — Progress Notes (Signed)
Patient is discharged from room 3C05 at this time. Alert and in stable condition. IV site d/c'd and instructions read to patient with understanding verbalized and all questions answered. Left unit via wheelchair with all belongings at side.  

## 2020-12-26 NOTE — Care Management CC44 (Signed)
Condition Code 44 Documentation Completed  Patient Details  Name: DELTON STELLE MRN: [id number] Date of Birth: 12-29-1940   Condition Code 44 given:  Yes Patient signature on Condition Code 44 notice:  Yes Documentation of 2 MD's agreement:  Yes Code 44 added to claim:  Yes    Lorri Frederick, LCSW 12/26/2020, 10:00 AM

## 2020-12-26 NOTE — Discharge Instructions (Signed)
Wound Care REMOVE OUTER DRESSING IN 3 DAYS Leave incision open to air. You may shower. Do not scrub directly on incision.  Do not put any creams, lotions, or ointments on incision. Activity Walk each and every day, increasing distance each day. No lifting greater than 5 lbs.  Avoid bending, arching, and twisting. No driving for 2 weeks; may ride as a passenger locally. If provided with back brace, wear when out of bed.  It is not necessary to wear in bed. Diet Resume your normal diet.  Return to Work Will be discussed at you follow up appointment. Call Your Doctor If Any of These Occur Redness, drainage, or swelling at the wound.  Temperature greater than 101 degrees. Severe pain not relieved by pain medication. Incision starts to come apart. Follow Up Appt Call today for appointment in 3-4 weeks ([phone]) or for problems.  If you have any hardware placed in your spine, you will need an x-ray before your appointment.

## 2020-12-26 NOTE — Evaluation (Signed)
Occupational Therapy Evaluation Patient Details Name: Harry Hale MRN: [id number] DOB: Feb 22, 1941 Today's Date: 12/26/2020    History of Present Illness Pt is a 80 y.o. male s/p PLIF 12/25/20.   Clinical Impression   Patient admitted for the diagnosis above.  Currently he is experiencing minimal back discomfort, but is essentially at his baseline for ADL completion and in room mobility/toileting.  No further needs in the acute setting.  Precautions reviewed, and all questions answered.      Follow Up Recommendations  No OT follow up    Equipment Recommendations  Tub/shower seat    Recommendations for Other Services       Precautions / Restrictions Precautions Precautions: Back Precaution Booklet Issued: Yes (comment) Precaution Comments: Educated on 3/3 back precautions. Handout provided. Required Braces or Orthoses: Spinal Brace Spinal Brace: Lumbar corset;Applied in standing position Restrictions Weight Bearing Restrictions: No      Mobility Bed Mobility Overal bed mobility: Modified Independent               Patient Response: Cooperative  Transfers Overall transfer level: Modified independent Equipment used: None                  Balance Overall balance assessment: No apparent balance deficits (not formally assessed)                                         ADL either performed or assessed with clinical judgement   ADL Overall ADL's : At baseline                                             Vision Patient Visual Report: No change from baseline       Perception     Praxis      Pertinent Vitals/Pain Pain Assessment: Faces Faces Pain Scale: Hurts a little bit Pain Location: back Pain Descriptors / Indicators: Grimacing;Operative site guarding Pain Intervention(s): Monitored during session     Hand Dominance Right   Extremity/Trunk Assessment Upper Extremity Assessment Upper Extremity  Assessment: Overall WFL for tasks assessed   Lower Extremity Assessment Lower Extremity Assessment: Defer to PT evaluation   Cervical / Trunk Assessment Cervical / Trunk Assessment: Other exceptions Cervical / Trunk Exceptions: s/p PLIF   Communication Communication Communication: No difficulties   Cognition Arousal/Alertness: Awake/alert Behavior During Therapy: WFL for tasks assessed/performed Overall Cognitive Status: Within Functional Limits for tasks assessed                                     General Comments  VSS on RA    Exercises     Shoulder Instructions      Home Living Family/patient expects to be discharged to:: Private residence Living Arrangements: Alone Available Help at Discharge: Family;Available PRN/intermittently Type of Home: House Home Access: Stairs to enter Entrance Stairs-Number of Steps: 1   Home Layout: Multi-level;Laundry or work area in basement Alternate Teacher, music of Steps: flight Alternate Level Stairs-Rails: Right Bathroom Shower/Tub: Chief Strategy Officer: Standard     Home Equipment: None          Prior Functioning/Environment Level of Independence: Independent  OT Problem List: Pain      OT Treatment/Interventions:      OT Goals(Current goals can be found in the care plan section) Acute Rehab OT Goals Patient Stated Goal: home today OT Goal Formulation: With patient Time For Goal Achievement: 12/26/20 Potential to Achieve Goals: Good  OT Frequency:     Barriers to D/C:  None noted          Co-evaluation              AM-PAC OT "6 Clicks" Daily Activity     Outcome Measure Help from another person eating meals?: None Help from another person taking care of personal grooming?: None Help from another person toileting, which includes using toliet, bedpan, or urinal?: None Help from another person bathing (including washing, rinsing, drying)?:  None Help from another person to put on and taking off regular upper body clothing?: None Help from another person to put on and taking off regular lower body clothing?: None 6 Click Score: 24   End of Session Nurse Communication: Mobility status  Activity Tolerance: Patient tolerated treatment well Patient left: in chair  OT Visit Diagnosis: Pain                Time: 0912-0926 OT Time Calculation (min): 14 min Charges:  OT General Charges $OT Visit: 1 Visit OT Evaluation $OT Eval Moderate Complexity: 1 Mod  12/26/2020  Rich, OTR/L  Acute Rehabilitation Services  Office:  [phone]   Suzanna Obey 12/26/2020, 10:26 AM

## 2020-12-26 NOTE — Progress Notes (Signed)
Subjective: Patient reports that he is doing well and is pleased with his postoperative status. He reports minimal incisional pain. No acute events overnight.   Objective: Vital signs in last 24 hours: Temp:  [97 F (36.1 C)-98 F (36.7 C)] 97.8 F (36.6 C) (06/15 0736) Pulse Rate:  [59-73] 59 (06/15 0736) Resp:  [9-20] 18 (06/15 0736) BP: (121-183)/(57-98) 121/93 (06/15 0736) SpO2:  [97 %-100 %] 98 % (06/15 0736)  Intake/Output from previous day: 06/14 0701 - 06/15 0700 In: 1240 [P.O.:240; I.V.:1000] Out: 895 [Urine:770; Blood:125] Intake/Output this shift: No intake/output data recorded.  Physical Exam: Patient is awake, A/O X 4, conversant, and in good spirits. Speech is fluent and appropriate. Doing well. MAEW with good strength that is symmetric bilaterally. Dressing is clean dry intact. Incision is well approximated with no drainage, erythema, or edema.    Lab Results: No results for input(s): WBC, HGB, HCT, PLT in the last 72 hours. BMET No results for input(s): NA, K, CL, CO2, GLUCOSE, BUN, CREATININE, CALCIUM in the last 72 hours.  Studies/Results: DG Lumbar Spine 2-3 Views  Result Date: 12/25/2020 CLINICAL DATA:  Lumbar decompression EXAM: DG C-ARM 1-60 MIN; LUMBAR SPINE - 2-3 VIEW COMPARISON:  11/13/2020 FLUOROSCOPY TIME:  Fluoroscopy Time:  28 seconds Radiation Exposure Index (if provided by the fluoroscopic device): 12.94 mGy Number of Acquired Spot Images: 2 FINDINGS: Interbody fusion at L4-5 is noted with pedicle screw placement for posterior fixation. No acute abnormality is noted. IMPRESSION: Changes consistent with interbody fusion at L4-5. Electronically Signed   By: Alcide Clever M.D.   On: 12/25/2020 10:41   DG C-Arm 1-60 Min  Result Date: 12/25/2020 CLINICAL DATA:  Lumbar decompression EXAM: DG C-ARM 1-60 MIN; LUMBAR SPINE - 2-3 VIEW COMPARISON:  11/13/2020 FLUOROSCOPY TIME:  Fluoroscopy Time:  28 seconds Radiation Exposure Index (if provided by the  fluoroscopic device): 12.94 mGy Number of Acquired Spot Images: 2 FINDINGS: Interbody fusion at L4-5 is noted with pedicle screw placement for posterior fixation. No acute abnormality is noted. IMPRESSION: Changes consistent with interbody fusion at L4-5. Electronically Signed   By: Alcide Clever M.D.   On: 12/25/2020 10:41    Assessment/Plan: Patient is post-op day 1 s/p L4-5 decompression and fusion. He is recovering well and reports a resolution of his preoperative symptoms.  His only complaint is mild incisional discomfort.  He has ambulated with nursing staff in the hall with no difficulty. He is awaiting PT/OT evaluation.  Continue LSO brace when OOB. Continue working on pain control, mobility and ambulating patient. Will plan for discharge today.    LOS: 1 day     Council Mechanic, DNP, NP-C 12/26/2020, 8:32 AM

## 2020-12-26 NOTE — Discharge Summary (Signed)
Physician Discharge Summary  Patient ID: Harry Hale MRN: [id number] DOB/AGE: 1941/05/26 80 y.o.  Admit date: 12/25/2020 Discharge date: 12/26/2020  Admission Diagnoses: Spinal stenosis of lumbar region with neurogenic claudication, spondylolisthesis, synovial cyst, lumbago, radiculopathy L 45 level  Discharge Diagnoses: Spinal stenosis of lumbar region with neurogenic claudication, spondylolisthesis, synovial cyst, lumbago, radiculopathy L 45 level Active Problems:   Spondylolisthesis of lumbar region   Discharged Condition: good  Hospital Course: The patient was admitted on 12/25/2020 and taken to the operating room where the patient underwent decompression and fusion. The patient tolerated the procedure well and was taken to the recovery room and then to the floor in stable condition. The hospital course was routine. There were no complications. The wound remained clean dry and intact. Pt had appropriate back soreness. No complaints of leg pain or new N/T/W. The patient remained afebrile with stable vital signs, and tolerated a regular diet. The patient continued to increase activities, and pain was well controlled with oral pain medications.   Consults: None  Significant Diagnostic Studies: radiology: X-Ray: intraoperative   Treatments: surgery: Lumbar four-five Decompression/Fusion/Resection of synovial cyst, posterior lumbar interbodyl fusion (N/A) with PEEK cages, autograft, pedicle screw fixation, posterolateral arthrodesis  Discharge Exam: Blood pressure (!) 121/93, pulse (!) 59, temperature 97.8 F (36.6 C), temperature source Oral, resp. rate 18, height 5\' 5"  (1.651 m), weight 70.7 kg, SpO2 98 %. Physical Exam: Patient is awake, A/O X 4, conversant, and in good spirits. Speech is fluent and appropriate. Doing well. MAEW with good strength that is symmetric bilaterally. Dressing is clean dry intact. Incision is well approximated with no drainage, erythema, or  edema.  Disposition: Discharge disposition: 01-Home or Self Care        Allergies as of 12/26/2020       Reactions   Nitrofuran Derivatives    Flu-like symptoms         Medication List     STOP taking these medications    meloxicam 15 MG tablet Commonly known as: MOBIC   traMADol 50 MG tablet Commonly known as: ULTRAM       TAKE these medications    CENTRUM PO Take 1 tablet by mouth daily.   gabapentin 300 MG capsule Commonly known as: NEURONTIN Take 300 mg by mouth 2 (two) times daily.   HYDROcodone-acetaminophen 5-325 MG tablet Commonly known as: NORCO/VICODIN Take 1-2 tablets by mouth every 6 (six) hours as needed for moderate pain.   levothyroxine 88 MCG tablet Commonly known as: SYNTHROID Take 88 mcg by mouth daily before breakfast.   losartan 100 MG tablet Commonly known as: COZAAR Take 100 mg by mouth daily.   methocarbamol 500 MG tablet Commonly known as: ROBAXIN Take 1 tablet (500 mg total) by mouth every 6 (six) hours as needed for muscle spasms.   simvastatin 10 MG tablet Commonly known as: ZOCOR Take 10 mg by mouth daily.         Signed: 12/28/2020, DNP, NP-C 12/26/2020, 9:12 AM

## 2022-11-10 IMAGING — RF DG C-ARM 1-60 MIN
1 series · 2 of 2 positions shown · non-contrast
Comparison: 11/13/2020

CLINICAL DATA: Lumbar decompression

EXAM:
DG C-ARM 1-60 MIN; LUMBAR SPINE - 2-3 VIEW

[Series 1: run · 2 of 2 slices shown]
[im 1/2]
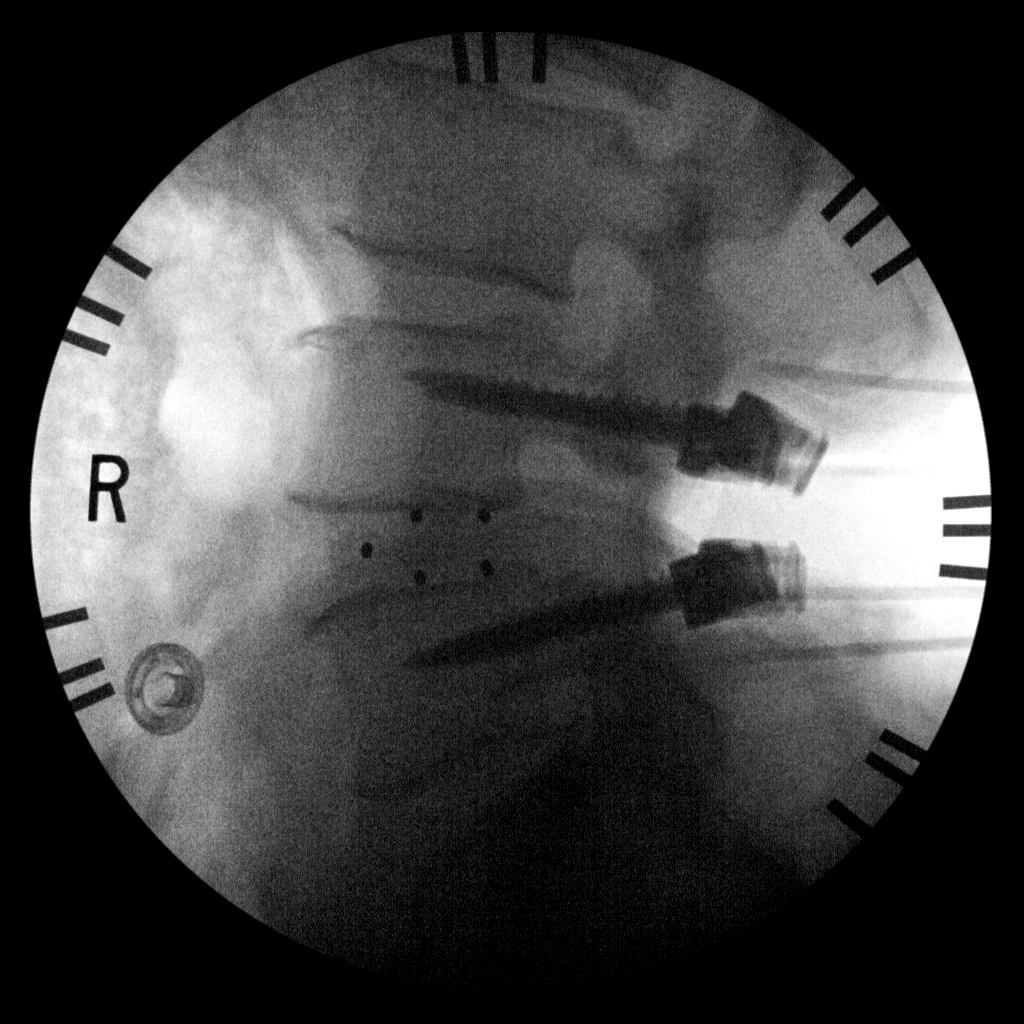
[im 2/2]
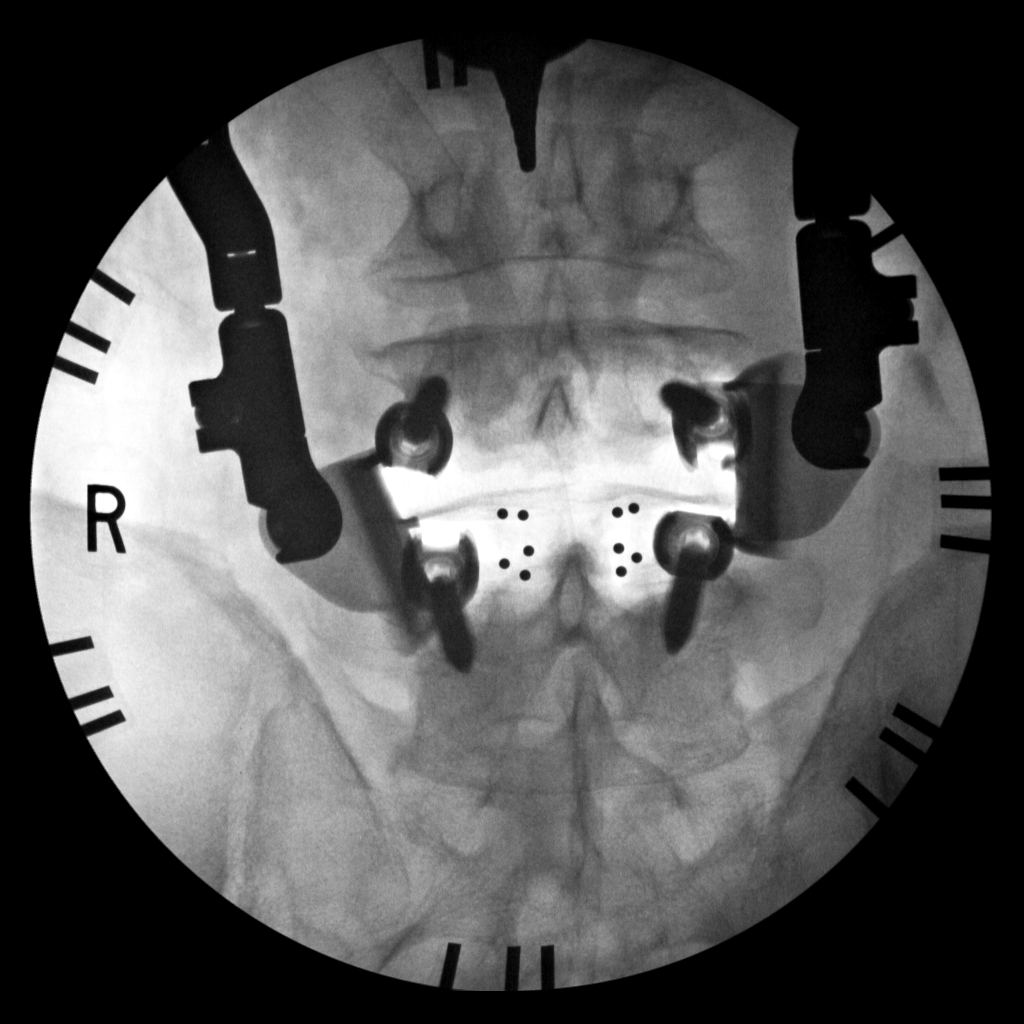

[2 of 2 positions shown; findings below may reference images not displayed]

FLUOROSCOPY TIME:  Fluoroscopy Time:  28 seconds

Radiation Exposure Index (if provided by the fluoroscopic device):
12.94 mGy

Number of Acquired Spot Images: 2
FINDINGS: Interbody fusion at L4-5 is noted with pedicle screw placement for
posterior fixation. No acute abnormality is noted.
IMPRESSION: Changes consistent with interbody fusion at L4-5.
